# Patient Record
Sex: Female | Born: 1995 | Race: White | Hispanic: No | Marital: Single | Smoking: Never smoker
Health system: Southern US, Community
[De-identification: ages and names within clinical notes are randomized; demographics above are authoritative.]

## PROBLEM LIST (undated history)

## (undated) DIAGNOSIS — S83289A Other tear of lateral meniscus, current injury, unspecified knee, initial encounter: Secondary | ICD-10-CM

## (undated) DIAGNOSIS — J3489 Other specified disorders of nose and nasal sinuses: Secondary | ICD-10-CM

## (undated) DIAGNOSIS — G43909 Migraine, unspecified, not intractable, without status migrainosus: Secondary | ICD-10-CM

## (undated) DIAGNOSIS — S83512A Sprain of anterior cruciate ligament of left knee, initial encounter: Secondary | ICD-10-CM

## (undated) HISTORY — PX: TYMPANOSTOMY TUBE PLACEMENT: SHX32

---
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
## 2024-07-09 ENCOUNTER — Emergency Department (HOSPITAL_BASED_OUTPATIENT_CLINIC_OR_DEPARTMENT_OTHER)

## 2024-07-09 ENCOUNTER — Emergency Department (HOSPITAL_BASED_OUTPATIENT_CLINIC_OR_DEPARTMENT_OTHER)
Admission: EM | Admit: 2024-07-09 | Discharge: 2024-07-09 | Disposition: A | Discharge status: Home / Self Care | Attending: Emergency Medicine | Admitting: Emergency Medicine

## 2024-07-09 ENCOUNTER — Other Ambulatory Visit: Payer: Self-pay

## 2024-07-09 DIAGNOSIS — S0990XA Unspecified injury of head, initial encounter: Secondary | ICD-10-CM

## 2024-07-09 DIAGNOSIS — M25572 Pain in left ankle and joints of left foot: Secondary | ICD-10-CM | POA: Diagnosis not present

## 2024-07-09 DIAGNOSIS — S0003XA Contusion of scalp, initial encounter: Secondary | ICD-10-CM | POA: Diagnosis not present

## 2024-07-09 DIAGNOSIS — Y92410 Unspecified street and highway as the place of occurrence of the external cause: Secondary | ICD-10-CM | POA: Insufficient documentation

## 2024-07-09 DIAGNOSIS — S7012XA Contusion of left thigh, initial encounter: Secondary | ICD-10-CM | POA: Insufficient documentation
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
## 2024-07-09 NOTE — Discharge Instructions (Signed)
 You were seen in the Emergency Department (ED) today for a head injury.  Based on your evaluation, you may have sustained a concussion (or bruise) to your brain.  If you had a CT scan done, it did not show any evidence of serious injury or bleeding.    Symptoms to expect from a concussion include nausea, mild to moderate headache, difficulty concentrating or sleeping, and mild lightheadedness.  These symptoms should improve over the next few days to weeks, but it may take many weeks before you feel back to normal.  Return to the emergency department or follow-up with your primary care doctor if your symptoms are not improving over this time.  Signs of a more serious head injury include vomiting, severe headache, excessive sleepiness or confusion, and weakness or numbness in your face, arms or legs.  Return immediately to the Emergency Department if you experience any of these more concerning symptoms.    Rest, avoid strenuous physical or mental activity, and avoid activities that could potentially result in another head injury until all your symptoms from this head injury are completely resolved for at least 2-3 weeks.  If you participate in sports, get cleared by your doctor or trainer before returning to play.  You may take ibuprofen or acetaminophen over the counter according to label instructions for mild headache or scalp soreness.

## 2024-07-10 ENCOUNTER — Telehealth (HOSPITAL_BASED_OUTPATIENT_CLINIC_OR_DEPARTMENT_OTHER): Payer: Self-pay | Admitting: Emergency Medicine

## 2024-07-10 MED ORDER — ONDANSETRON HCL 4 MG PO TABS: 4.0000 mg | ORAL_TABLET | Freq: Three times a day (TID) | ORAL | 0 refills | Status: AC | PRN

## 2024-07-10 MED ORDER — CYCLOBENZAPRINE HCL 10 MG PO TABS: 10.0000 mg | ORAL_TABLET | Freq: Two times a day (BID) | ORAL | 0 refills | Status: AC | PRN

## 2024-07-10 NOTE — Telephone Encounter (Signed)
 Pt called asking for rx adjustment, sent
